# Patient Record
Sex: Male | Born: 2005 | Race: Black or African American | Hispanic: No | Marital: Single | Smoking: Never smoker
Health system: Southern US, Community
[De-identification: ages and names within clinical notes are randomized; demographics above are authoritative.]

## PROBLEM LIST (undated history)

## (undated) DIAGNOSIS — S43015A Anterior dislocation of left humerus, initial encounter: Secondary | ICD-10-CM

## (undated) DIAGNOSIS — S43492A Other sprain of left shoulder joint, initial encounter: Secondary | ICD-10-CM

## (undated) DIAGNOSIS — S43432A Superior glenoid labrum lesion of left shoulder, initial encounter: Secondary | ICD-10-CM

## (undated) HISTORY — PX: NO PAST SURGERIES: SHX2092

---
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
## 2024-05-03 ENCOUNTER — Other Ambulatory Visit: Payer: Self-pay

## 2024-05-03 ENCOUNTER — Encounter (HOSPITAL_BASED_OUTPATIENT_CLINIC_OR_DEPARTMENT_OTHER): Payer: Self-pay | Admitting: Emergency Medicine

## 2024-05-03 ENCOUNTER — Emergency Department (HOSPITAL_BASED_OUTPATIENT_CLINIC_OR_DEPARTMENT_OTHER)
Admission: EM | Admit: 2024-05-03 | Discharge: 2024-05-03 | Disposition: A | Discharge status: Home / Self Care | Attending: Emergency Medicine | Admitting: Emergency Medicine

## 2024-05-03 DIAGNOSIS — J039 Acute tonsillitis, unspecified: Secondary | ICD-10-CM | POA: Diagnosis present

## 2024-05-03 LAB — RESP PANEL BY RT-PCR (RSV, FLU A&B, COVID)  RVPGX2
Influenza A by PCR: NEGATIVE
Influenza B by PCR: NEGATIVE
Resp Syncytial Virus by PCR: NEGATIVE
SARS Coronavirus 2 by RT PCR: NEGATIVE

## 2024-05-03 LAB — GROUP A STREP BY PCR: Group A Strep by PCR: NOT DETECTED

## 2024-05-03 MED ORDER — DEXAMETHASONE SOD PHOSPHATE PF 10 MG/ML IJ SOLN
10.0000 mg | Freq: Once | INTRAMUSCULAR | Status: AC
  Administered 2024-05-03: 10 mg via INTRAMUSCULAR

## 2024-05-03 MED ORDER — AMOXICILLIN-POT CLAVULANATE 875-125 MG PO TABS: 1.0000 | ORAL_TABLET | Freq: Two times a day (BID) | ORAL | 0 refills | Status: DC

## 2024-05-03 MED ORDER — AMOXICILLIN-POT CLAVULANATE 875-125 MG PO TABS
1.0000 | ORAL_TABLET | Freq: Once | ORAL | Status: AC
  Administered 2024-05-03: 1 via ORAL
  Filled 2024-05-03: qty 1

## 2024-05-03 NOTE — ED Provider Notes (Signed)
 " [place] EMERGENCY DEPARTMENT AT [facility] Provider Note   CSN: [id number] Arrival date & time: 05/03/24  1758     Patient presents with: Sore Throat   Kirk Berg is a 18 y.o. male.   18 year old male presents with his sister for concern of sore throat that has been ongoing for 2 days.  He did a video visit yesterday and was prescribed Zithromax and lidocaine  solution to gargle with yesterday without any significant improvement in symptoms since then.  He denies any sick contacts.  Has been tolerating p.o. intake.  He states his p.o. fluids has decreased today but typically he takes in about 8 bottles.  Today he has had 3 bottles.  No fever.  No drooling.  The history is provided by the patient. No language interpreter was used.       Prior to Admission medications  Medication Sig Start Date End Date Taking? Authorizing Provider  famotidine  (PEPCID ) 20 MG tablet Take 1 tablet (20 mg total) by mouth 2 (two) times daily. 11/03/21   Palumbo, April, MD  oxyCODONE  (OXY IR/ROXICODONE ) 5 MG immediate release tablet Take 1 tablet (5 mg total) by mouth every 4 (four) hours as needed for severe pain. 12/17/19   Shepperson, Kirstin, PA-C  polyethylene glycol (MIRALAX ) 17 g packet Take 17 g by mouth daily. 11/03/21   Palumbo, April, MD  promethazine  (PHENERGAN ) 12.5 MG tablet Take 1 tablet (12.5 mg total) by mouth every 8 (eight) hours as needed for nausea or vomiting. 12/17/19   Shepperson, Kirstin, PA-C    Allergies: Patient has no known allergies.    Review of Systems  Constitutional:  Negative for chills and fever.  HENT:  Positive for sore throat. Negative for drooling and trouble swallowing.   Respiratory:  Negative for shortness of breath.   All other systems reviewed and are negative.   Updated Vital Signs BP 135/84 (BP Location: Right Arm)   Pulse (!) 112   Temp 97.8 F (36.6 C)   Resp 18   Ht 6' (1.829 m)   Wt 88.5 kg   SpO2 100%   BMI 26.45 kg/m    Physical Exam Vitals and nursing note reviewed.  Constitutional:      General: He is not in acute distress.    Appearance: Normal appearance. He is not ill-appearing.  HENT:     Head: Normocephalic and atraumatic.     Nose: Nose normal.     Mouth/Throat:     Comments: Enlarged tonsils bilaterally without exudates.  No evidence of RPA, PTA, or trismus.  Tolerating secretions.  Normal speech. Eyes:     Conjunctiva/sclera: Conjunctivae normal.  Pulmonary:     Effort: Pulmonary effort is normal. No respiratory distress.  Musculoskeletal:        General: No deformity.  Skin:    Findings: No rash.  Neurological:     Mental Status: He is alert.     (all labs ordered are listed, but only abnormal results are displayed) Labs Reviewed  RESP PANEL BY RT-PCR (RSV, FLU A&B, COVID)  [id number]  GROUP A STREP BY PCR    EKG: None  Radiology: No results found.   Procedures   Medications Ordered in the ED - No data to display                                  Medical Decision Making  18 year old male presents today  for concern of pharyngitis that has been ongoing for the past 2 days.  Tolerating p.o. intake.  Nontoxic-appearing on exam.  Initially noted to be tachycardic however on my evaluation his heart rate was about 90.  On exam he does have bilateral swollen tonsils with exudates as well.  No evidence of peritonsillar abscess. Will switch him to Augmentin  and give him a dose of dexamethasone  in the emergency department along with his first dose of Augmentin .  Discussed increasing his hydration. Patient discharged in stable condition. Return precaution discussed. He voices understanding and is in agreement with plan.   Final diagnoses:  Tonsillitis    ED Discharge Orders          Ordered    amoxicillin -clavulanate (AUGMENTIN ) 875-125 MG tablet  Every 12 hours        05/03/24 2027               Hildegard Loge, PA-C 05/03/24 2031    Patt Alm Macho,  MD 05/03/24 [phone]  "

## 2024-05-03 NOTE — Discharge Instructions (Addendum)
 You have tonsillitis.  We switched your antibiotics to Augmentin .  You can stop taking Zithromax.  You can also do warm salt water gargles to help with the inflammation.  Drink plenty of fluids.  Increase how much water you are drinking.  Follow-up with your primary care doctor.  Return for any emergent symptoms such as not been able to tolerate anything by mouth, drooling.

## 2024-05-03 NOTE — ED Triage Notes (Signed)
Pt c/o sore throat x 2d  

## 2024-05-05 ENCOUNTER — Encounter (HOSPITAL_BASED_OUTPATIENT_CLINIC_OR_DEPARTMENT_OTHER): Payer: Self-pay

## 2024-05-05 ENCOUNTER — Emergency Department (HOSPITAL_BASED_OUTPATIENT_CLINIC_OR_DEPARTMENT_OTHER)
Admission: EM | Admit: 2024-05-05 | Discharge: 2024-05-05 | Disposition: A | Discharge status: Home / Self Care | Attending: Emergency Medicine | Admitting: Emergency Medicine

## 2024-05-05 ENCOUNTER — Other Ambulatory Visit: Payer: Self-pay

## 2024-05-05 DIAGNOSIS — R070 Pain in throat: Secondary | ICD-10-CM | POA: Diagnosis present

## 2024-05-05 DIAGNOSIS — B2790 Infectious mononucleosis, unspecified without complication: Secondary | ICD-10-CM | POA: Diagnosis not present

## 2024-05-05 LAB — CBC WITH DIFFERENTIAL/PLATELET
Abs Immature Granulocytes: 0.04 K/uL (ref 0.00–0.07)
Basophils Absolute: 0.1 K/uL (ref 0.0–0.1)
Basophils Relative: 1 %
Eosinophils Absolute: 0.1 K/uL (ref 0.0–0.5)
Eosinophils Relative: 1 %
HCT: 40.2 % (ref 39.0–52.0)
Hemoglobin: 13.3 g/dL (ref 13.0–17.0)
Immature Granulocytes: 0 %
Lymphocytes Relative: 45 %
Lymphs Abs: 4.6 K/uL — ABNORMAL HIGH (ref 0.7–4.0)
MCH: 27.3 pg (ref 26.0–34.0)
MCHC: 33.1 g/dL (ref 30.0–36.0)
MCV: 82.5 fL (ref 80.0–100.0)
Monocytes Absolute: 1.5 K/uL — ABNORMAL HIGH (ref 0.1–1.0)
Monocytes Relative: 14 %
Neutro Abs: 4 K/uL (ref 1.7–7.7)
Neutrophils Relative %: 39 %
Platelets: 266 K/uL (ref 150–400)
RBC: 4.87 MIL/uL (ref 4.22–5.81)
RDW: 13.1 % (ref 11.5–15.5)
Smear Review: NORMAL
WBC: 10.3 K/uL (ref 4.0–10.5)
nRBC: 0 % (ref 0.0–0.2)

## 2024-05-05 LAB — BASIC METABOLIC PANEL WITH GFR
Anion gap: 12 (ref 5–15)
BUN: 16 mg/dL (ref 6–20)
CO2: 26 mmol/L (ref 22–32)
Calcium: 9.2 mg/dL (ref 8.9–10.3)
Chloride: 102 mmol/L (ref 98–111)
Creatinine, Ser: 0.97 mg/dL (ref 0.61–1.24)
GFR, Estimated: >60 mL/min
Glucose, Bld: 118 mg/dL — ABNORMAL HIGH (ref 70–99)
Potassium: 3.8 mmol/L (ref 3.5–5.1)
Sodium: 140 mmol/L (ref 135–145)

## 2024-05-05 LAB — MONONUCLEOSIS SCREEN: Mono Screen: POSITIVE — AB

## 2024-05-05 MED ORDER — DEXAMETHASONE SOD PHOSPHATE PF 10 MG/ML IJ SOLN
10.0000 mg | Freq: Once | INTRAMUSCULAR | Status: AC
  Administered 2024-05-05: 10 mg via INTRAVENOUS

## 2024-05-05 MED ORDER — LIDOCAINE VISCOUS HCL 2 % MT SOLN: 15.0000 mL | OROMUCOSAL | Status: DC | PRN

## 2024-05-05 MED ORDER — IBUPROFEN 800 MG PO TABS: 800.0000 mg | ORAL_TABLET | Freq: Four times a day (QID) | ORAL | 0 refills | Status: AC | PRN

## 2024-05-05 MED ORDER — KETOROLAC TROMETHAMINE 30 MG/ML IJ SOLN
15.0000 mg | Freq: Once | INTRAMUSCULAR | Status: AC
  Administered 2024-05-05: 15 mg via INTRAVENOUS
  Filled 2024-05-05: qty 1

## 2024-05-05 MED ORDER — HYDROCODONE-ACETAMINOPHEN 7.5-325 MG/15ML PO SOLN: 15.0000 mL | Freq: Four times a day (QID) | ORAL | 0 refills | Status: AC | PRN

## 2024-05-05 MED ORDER — LIDOCAINE VISCOUS HCL 2 % MT SOLN: 15.0000 mL | OROMUCOSAL | 0 refills | Status: AC | PRN

## 2024-05-05 MED ORDER — OXYCODONE-ACETAMINOPHEN 5-325 MG PO TABS
1.0000 | ORAL_TABLET | Freq: Once | ORAL | Status: AC
  Administered 2024-05-05: 1 via ORAL
  Filled 2024-05-05: qty 1

## 2024-05-05 MED ORDER — SODIUM CHLORIDE 0.9 % IV BOLUS
1000.0000 mL | Freq: Once | INTRAVENOUS | Status: AC
  Administered 2024-05-05: 1000 mL via INTRAVENOUS

## 2024-05-05 NOTE — ED Triage Notes (Signed)
 Pt was seen on 12/28 dx with tonsillitis.  Started on Augmentin  States his pain has become worse and is now having difficulty swallowing

## 2024-05-05 NOTE — ED Provider Notes (Signed)
 " [facility] Provider Note   CSN: [id number] Arrival date & time: 05/05/24  0210     Patient presents with: Sore Throat   Kirk Berg is a 18 y.o. male.   Patient returns to the emergency department for persistent throat pain.  Patient was started on Zithromax by PCP and then prescription was changed to Augmentin  in the ED the other day.  Patient reports persistent pain, trouble swallowing.       Prior to Admission medications  Not on File    Allergies: Patient has no known allergies.    Review of Systems  Updated Vital Signs BP (!) 142/73 (BP Location: Right Arm)   Pulse 99   Temp 100 F (37.8 C)   Resp 18   Ht 6' (1.829 m)   Wt 88 kg   SpO2 100%   BMI 26.31 kg/m   Physical Exam Vitals and nursing note reviewed.  Constitutional:      General: He is not in acute distress.    Appearance: He is well-developed.  HENT:     Head: Normocephalic and atraumatic.     Mouth/Throat:     Mouth: Mucous membranes are moist.     Tonsils: Tonsillar exudate present. No tonsillar abscesses. 3+ on the right. 3+ on the left.  Eyes:     General: Vision grossly intact. Gaze aligned appropriately.     Extraocular Movements: Extraocular movements intact.     Conjunctiva/sclera: Conjunctivae normal.  Cardiovascular:     Rate and Rhythm: Normal rate and regular rhythm.     Pulses: Normal pulses.     Heart sounds: Normal heart sounds, S1 normal and S2 normal. No murmur heard.    No friction rub. No gallop.  Pulmonary:     Effort: Pulmonary effort is normal. No respiratory distress.     Breath sounds: Normal breath sounds.  Abdominal:     Palpations: Abdomen is soft.     Tenderness: There is no abdominal tenderness. There is no guarding or rebound.     Hernia: No hernia is present.  Musculoskeletal:        General: No swelling.     Cervical back: Full passive range of motion without pain, normal range of motion and neck supple.  No pain with movement, spinous process tenderness or muscular tenderness. Normal range of motion.     Right lower leg: No edema.     Left lower leg: No edema.  Skin:    General: Skin is warm and dry.     Capillary Refill: Capillary refill takes less than 2 seconds.     Findings: No ecchymosis, erythema, lesion or wound.  Neurological:     Mental Status: He is alert and oriented to person, place, and time.     GCS: GCS eye subscore is 4. GCS verbal subscore is 5. GCS motor subscore is 6.     Cranial Nerves: Cranial nerves 2-12 are intact.     Sensory: Sensation is intact.     Motor: Motor function is intact. No weakness or abnormal muscle tone.     Coordination: Coordination is intact.  Psychiatric:        Mood and Affect: Mood normal.        Speech: Speech normal.        Behavior: Behavior normal.     (all labs ordered are listed, but only abnormal results are displayed) Labs Reviewed  CBC WITH DIFFERENTIAL/PLATELET - Abnormal; Notable for  the following components:      Result Value   Lymphs Abs 4.6 (*)    Monocytes Absolute 1.5 (*)    All other components within normal limits  BASIC METABOLIC PANEL WITH GFR - Abnormal; Notable for the following components:   Glucose, Bld 118 (*)    All other components within normal limits  MONONUCLEOSIS SCREEN - Abnormal; Notable for the following components:   Mono Screen POSITIVE (*)    All other components within normal limits    EKG: None  Radiology: No results found.   Procedures   Medications Ordered in the ED  oxyCODONE -acetaminophen  (PERCOCET/ROXICET) 5-325 MG per tablet 1 tablet (1 tablet Oral Given 05/05/24 0225)  sodium chloride  0.9 % bolus 1,000 mL (1,000 mLs Intravenous New Bag/Given 05/05/24 0422)  ketorolac  (TORADOL ) 30 MG/ML injection 15 mg (15 mg Intravenous Given 05/05/24 0423)  dexamethasone  (DECADRON ) injection 10 mg (10 mg Intravenous Given 05/05/24 0423)                                    Medical Decision  Making Amount and/or Complexity of Data Reviewed Labs: ordered.  Risk Prescription drug management.   Differential diagnosis considered includes, but not limited to: Viral pharyngitis; strep pharyngitis; peritonsillar abscess; mono  Presents to the emergency department for persistent throat pain despite 2 different antibiotics.  Oropharyngeal examination reveals significant tonsillar enlargement bilaterally which is symmetric, no midline shift.  Nothing to indicate peritonsillar abscess.  Patient has significant erythema and exudate.  White blood cell count is normal.  Monotest is positive which explains his current symptoms.     Final diagnoses:  Infectious mononucleosis without complication, infectious mononucleosis due to unspecified organism    ED Discharge Orders     None          Haze Lonni PARAS, MD 05/05/24 [phone]  "
